# Patient Record
Sex: Female | Born: 2005 | Race: Black or African American | Hispanic: No | Marital: Single | Smoking: Never smoker
Health system: Southern US, Community
[De-identification: ages and names within clinical notes are randomized; demographics above are authoritative.]

---
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
## 2023-02-15 DIAGNOSIS — B974 Respiratory syncytial virus as the cause of diseases classified elsewhere: Secondary | ICD-10-CM | POA: Diagnosis not present

## 2023-02-15 DIAGNOSIS — Z1152 Encounter for screening for COVID-19: Secondary | ICD-10-CM | POA: Diagnosis not present

## 2023-02-15 DIAGNOSIS — J069 Acute upper respiratory infection, unspecified: Secondary | ICD-10-CM | POA: Diagnosis not present

## 2023-02-15 DIAGNOSIS — R509 Fever, unspecified: Secondary | ICD-10-CM | POA: Diagnosis not present

## 2023-05-22 DIAGNOSIS — B349 Viral infection, unspecified: Secondary | ICD-10-CM | POA: Diagnosis not present

## 2023-05-22 DIAGNOSIS — Z20822 Contact with and (suspected) exposure to covid-19: Secondary | ICD-10-CM | POA: Diagnosis not present

## 2023-05-22 DIAGNOSIS — R509 Fever, unspecified: Secondary | ICD-10-CM | POA: Diagnosis not present

## 2023-07-03 DIAGNOSIS — Z113 Encounter for screening for infections with a predominantly sexual mode of transmission: Secondary | ICD-10-CM | POA: Diagnosis not present

## 2023-07-10 DIAGNOSIS — R0602 Shortness of breath: Secondary | ICD-10-CM | POA: Diagnosis not present

## 2023-07-10 DIAGNOSIS — Z7251 High risk heterosexual behavior: Secondary | ICD-10-CM | POA: Diagnosis not present

## 2023-07-10 DIAGNOSIS — Z0000 Encounter for general adult medical examination without abnormal findings: Secondary | ICD-10-CM | POA: Diagnosis not present

## 2023-07-10 DIAGNOSIS — Z1339 Encounter for screening examination for other mental health and behavioral disorders: Secondary | ICD-10-CM | POA: Diagnosis not present

## 2023-07-10 DIAGNOSIS — R7303 Prediabetes: Secondary | ICD-10-CM | POA: Diagnosis not present

## 2023-07-10 DIAGNOSIS — E559 Vitamin D deficiency, unspecified: Secondary | ICD-10-CM | POA: Diagnosis not present

## 2023-07-10 DIAGNOSIS — Z3200 Encounter for pregnancy test, result unknown: Secondary | ICD-10-CM | POA: Diagnosis not present

## 2023-07-10 DIAGNOSIS — F419 Anxiety disorder, unspecified: Secondary | ICD-10-CM | POA: Diagnosis not present

## 2023-07-10 DIAGNOSIS — Z1331 Encounter for screening for depression: Secondary | ICD-10-CM | POA: Diagnosis not present

## 2023-07-10 DIAGNOSIS — E0590 Thyrotoxicosis, unspecified without thyrotoxic crisis or storm: Secondary | ICD-10-CM | POA: Diagnosis not present

## 2023-10-23 DIAGNOSIS — N3001 Acute cystitis with hematuria: Secondary | ICD-10-CM | POA: Diagnosis not present

## 2023-10-23 DIAGNOSIS — Z113 Encounter for screening for infections with a predominantly sexual mode of transmission: Secondary | ICD-10-CM | POA: Diagnosis not present

## 2023-10-23 DIAGNOSIS — Z7251 High risk heterosexual behavior: Secondary | ICD-10-CM | POA: Diagnosis not present

## 2023-12-03 DIAGNOSIS — Z30432 Encounter for removal of intrauterine contraceptive device: Secondary | ICD-10-CM | POA: Diagnosis not present
